# Patient Record
Sex: Male | Born: 1985 | Race: Black or African American | Hispanic: No | Marital: Single | Smoking: Light tobacco smoker
Health system: Southern US, Community
[De-identification: ages and names within clinical notes are randomized; demographics above are authoritative.]

---

## 2014-08-18 ENCOUNTER — Emergency Department (HOSPITAL_COMMUNITY)
Admission: EM | Admit: 2014-08-18 | Discharge: 2014-08-19 | Disposition: A | Discharge status: Home / Self Care | Payer: Self-pay | Attending: Emergency Medicine | Admitting: Emergency Medicine

## 2014-08-18 DIAGNOSIS — Z720 Tobacco use: Secondary | ICD-10-CM | POA: Insufficient documentation

## 2014-08-18 DIAGNOSIS — R4182 Altered mental status, unspecified: Secondary | ICD-10-CM | POA: Insufficient documentation
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
## 2014-08-19 ENCOUNTER — Emergency Department (HOSPITAL_COMMUNITY): Payer: Self-pay

## 2014-08-19 ENCOUNTER — Encounter (HOSPITAL_COMMUNITY): Payer: Self-pay | Admitting: Emergency Medicine

## 2014-08-19 LAB — COMPREHENSIVE METABOLIC PANEL
ALBUMIN: 4.7 g/dL (ref 3.5–5.2)
ALT: 41 U/L (ref 0–53)
ANION GAP: 10 (ref 5–15)
AST: 26 U/L (ref 0–37)
Alkaline Phosphatase: 70 U/L (ref 39–117)
BILIRUBIN TOTAL: 1 mg/dL (ref 0.3–1.2)
BUN: 9 mg/dL (ref 6–23)
CHLORIDE: 101 meq/L (ref 96–112)
CO2: 27 mmol/L (ref 19–32)
CREATININE: 1.37 mg/dL — AB (ref 0.50–1.35)
Calcium: 9.6 mg/dL (ref 8.4–10.5)
GFR, EST AFRICAN AMERICAN: 79 mL/min — AB (ref >90)
GFR, EST NON AFRICAN AMERICAN: 69 mL/min — AB (ref >90)
Glucose, Bld: 133 mg/dL — ABNORMAL HIGH (ref 70–99)
Potassium: 3.2 mmol/L — ABNORMAL LOW (ref 3.5–5.1)
SODIUM: 138 mmol/L (ref 135–145)
Total Protein: 8.5 g/dL — ABNORMAL HIGH (ref 6.0–8.3)

## 2014-08-19 LAB — CBC WITH DIFFERENTIAL/PLATELET
BASOS PCT: 0 % (ref 0–1)
Basophils Absolute: 0 10*3/uL (ref 0.0–0.1)
EOS ABS: 0.2 10*3/uL (ref 0.0–0.7)
Eosinophils Relative: 2 % (ref 0–5)
HEMATOCRIT: 46.2 % (ref 39.0–52.0)
HEMOGLOBIN: 16.4 g/dL (ref 13.0–17.0)
LYMPHS ABS: 1.8 10*3/uL (ref 0.7–4.0)
LYMPHS PCT: 18 % (ref 12–46)
MCH: 32.8 pg (ref 26.0–34.0)
MCHC: 35.5 g/dL (ref 30.0–36.0)
MCV: 92.4 fL (ref 78.0–100.0)
Monocytes Absolute: 0.6 10*3/uL (ref 0.1–1.0)
Monocytes Relative: 5 % (ref 3–12)
NEUTROS PCT: 75 % (ref 43–77)
Neutro Abs: 7.7 10*3/uL (ref 1.7–7.7)
Platelets: 270 10*3/uL (ref 150–400)
RBC: 5 MIL/uL (ref 4.22–5.81)
RDW: 11.8 % (ref 11.5–15.5)
WBC: 10.2 10*3/uL (ref 4.0–10.5)

## 2014-08-19 LAB — RAPID URINE DRUG SCREEN, HOSP PERFORMED
Amphetamines: NOT DETECTED
BARBITURATES: NOT DETECTED
BENZODIAZEPINES: NOT DETECTED
Cocaine: NOT DETECTED
Opiates: NOT DETECTED
Tetrahydrocannabinol: POSITIVE — AB

## 2014-08-19 LAB — URINALYSIS, ROUTINE W REFLEX MICROSCOPIC
BILIRUBIN URINE: NEGATIVE
GLUCOSE, UA: NEGATIVE mg/dL
Hgb urine dipstick: NEGATIVE
KETONES UR: NEGATIVE mg/dL
Leukocytes, UA: NEGATIVE
Nitrite: NEGATIVE
PH: 6 (ref 5.0–8.0)
PROTEIN: NEGATIVE mg/dL
SPECIFIC GRAVITY, URINE: 1.011 (ref 1.005–1.030)
Urobilinogen, UA: 0.2 mg/dL (ref 0.0–1.0)

## 2014-08-19 LAB — ETHANOL

## 2014-08-19 NOTE — ED Notes (Signed)
Patient brought in by EMS. Pt was found bent over drooling and unable to talk by [employer]. Patient unable to talk coherently. When able to communicate patient asked the same questions over and over again.

## 2014-08-19 NOTE — Discharge Instructions (Signed)

## 2016-04-23 ENCOUNTER — Encounter: Payer: Self-pay | Admitting: Emergency Medicine

## 2016-04-23 ENCOUNTER — Emergency Department
Admission: EM | Admit: 2016-04-23 | Discharge: 2016-04-23 | Disposition: A | Discharge status: Home / Self Care | Attending: Emergency Medicine | Admitting: Emergency Medicine

## 2016-04-23 DIAGNOSIS — F17210 Nicotine dependence, cigarettes, uncomplicated: Secondary | ICD-10-CM | POA: Insufficient documentation

## 2016-04-23 DIAGNOSIS — Y9389 Activity, other specified: Secondary | ICD-10-CM | POA: Insufficient documentation

## 2016-04-23 DIAGNOSIS — S0181XA Laceration without foreign body of other part of head, initial encounter: Secondary | ICD-10-CM | POA: Diagnosis not present

## 2016-04-23 DIAGNOSIS — Y999 Unspecified external cause status: Secondary | ICD-10-CM | POA: Diagnosis not present

## 2016-04-23 DIAGNOSIS — Y929 Unspecified place or not applicable: Secondary | ICD-10-CM | POA: Diagnosis not present

## 2016-04-23 NOTE — Discharge Instructions (Signed)
Advised not to shave area for one week.
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
## 2020-10-09 ENCOUNTER — Ambulatory Visit (HOSPITAL_COMMUNITY)
Admission: EM | Admit: 2020-10-09 | Discharge: 2020-10-09 | Disposition: A | Discharge status: Home / Self Care | Payer: Medicaid - Out of State

## 2020-10-09 ENCOUNTER — Other Ambulatory Visit: Payer: Self-pay

## 2020-10-09 NOTE — ED Notes (Signed)
PT belongings in orange locker.

## 2020-10-09 NOTE — ED Notes (Signed)
Pt accepted vitals being taken. When writer redirected pt to have a seat for more accurate vitals, pt stood up abruptly, yanked Blood Pressure cuff off arm, and rushed out of assessment room.

## 2020-10-09 NOTE — BH Assessment (Signed)
LWBS/AWOL:  Pt arrived to the [facility] via his cousin, who states she had insisted he come tonight. Pt's cousin shared pt has been acting paranoid and thinks people are out to get him; clinician witnessed pt acting nervous when he walked past security (holding his cousin close to him). Pt's cousin shared pt used PCP last Monday - Wednesday; she shares pt had been in prison for 3 1/2 years and was released in February. Pt's cousin shared pt has not been sleeping and that his family is worried.  Pt refused to complete the assessment, stating he wanted to go home to rest and return in the morning. Pt denied SI, HI, or AVH. Despite re-directions and requests, pt demanded to leave and walked out of the room. Clinician provided pt's cousin with information so she could IVC patient.

## 2020-10-10 ENCOUNTER — Ambulatory Visit (HOSPITAL_COMMUNITY)
Admission: EM | Admit: 2020-10-10 | Discharge: 2020-10-11 | Disposition: A | Discharge status: Home / Self Care | Payer: Medicaid - Out of State | Attending: Psychiatry | Admitting: Psychiatry

## 2020-10-10 ENCOUNTER — Telehealth (HOSPITAL_COMMUNITY): Payer: Self-pay | Admitting: General Practice

## 2020-10-10 ENCOUNTER — Other Ambulatory Visit: Payer: Self-pay

## 2020-10-10 DIAGNOSIS — F17210 Nicotine dependence, cigarettes, uncomplicated: Secondary | ICD-10-CM | POA: Diagnosis not present

## 2020-10-10 DIAGNOSIS — F19959 Other psychoactive substance use, unspecified with psychoactive substance-induced psychotic disorder, unspecified: Secondary | ICD-10-CM | POA: Insufficient documentation

## 2020-10-10 DIAGNOSIS — Z20822 Contact with and (suspected) exposure to covid-19: Secondary | ICD-10-CM | POA: Insufficient documentation

## 2020-10-10 LAB — LIPID PANEL
Cholesterol: 193 mg/dL (ref 0–200)
HDL: 41 mg/dL (ref >40)
LDL Cholesterol: 125 mg/dL — ABNORMAL HIGH (ref 0–99)
Total CHOL/HDL Ratio: 4.7 RATIO
Triglycerides: 133 mg/dL (ref <150)
VLDL: 27 mg/dL (ref 0–40)

## 2020-10-10 LAB — COMPREHENSIVE METABOLIC PANEL
ALT: 21 U/L (ref 0–44)
AST: 33 U/L (ref 15–41)
Albumin: 4.6 g/dL (ref 3.5–5.0)
Alkaline Phosphatase: 59 U/L (ref 38–126)
Anion gap: 10 (ref 5–15)
BUN: 7 mg/dL (ref 6–20)
CO2: 22 mmol/L (ref 22–32)
Calcium: 10.1 mg/dL (ref 8.9–10.3)
Chloride: 99 mmol/L (ref 98–111)
Creatinine, Ser: 0.97 mg/dL (ref 0.61–1.24)
GFR, Estimated: >60 mL/min (ref >60)
Glucose, Bld: 84 mg/dL (ref 70–99)
Potassium: 4.1 mmol/L (ref 3.5–5.1)
Sodium: 131 mmol/L — ABNORMAL LOW (ref 135–145)
Total Bilirubin: 1.3 mg/dL — ABNORMAL HIGH (ref 0.3–1.2)
Total Protein: 7.7 g/dL (ref 6.5–8.1)

## 2020-10-10 LAB — HEMOGLOBIN A1C
Hgb A1c MFr Bld: 4.7 % — ABNORMAL LOW (ref 4.8–5.6)
Mean Plasma Glucose: 88.19 mg/dL

## 2020-10-10 LAB — CBC WITH DIFFERENTIAL/PLATELET
Abs Immature Granulocytes: 0.04 10*3/uL (ref 0.00–0.07)
Basophils Absolute: 0.1 10*3/uL (ref 0.0–0.1)
Basophils Relative: 1 %
Eosinophils Absolute: 0.1 10*3/uL (ref 0.0–0.5)
Eosinophils Relative: 1 %
HCT: 49.1 % (ref 39.0–52.0)
Hemoglobin: 16.8 g/dL (ref 13.0–17.0)
Immature Granulocytes: 0 %
Lymphocytes Relative: 14 %
Lymphs Abs: 1.9 10*3/uL (ref 0.7–4.0)
MCH: 32.7 pg (ref 26.0–34.0)
MCHC: 34.2 g/dL (ref 30.0–36.0)
MCV: 95.7 fL (ref 80.0–100.0)
Monocytes Absolute: 0.8 10*3/uL (ref 0.1–1.0)
Monocytes Relative: 6 %
Neutro Abs: 10.4 10*3/uL — ABNORMAL HIGH (ref 1.7–7.7)
Neutrophils Relative %: 78 %
Platelets: 282 10*3/uL (ref 150–400)
RBC: 5.13 MIL/uL (ref 4.22–5.81)
RDW: 12.3 % (ref 11.5–15.5)
WBC: 13.3 10*3/uL — ABNORMAL HIGH (ref 4.0–10.5)
nRBC: 0 % (ref 0.0–0.2)

## 2020-10-10 LAB — POCT URINE DRUG SCREEN - MANUAL ENTRY (I-CUP)
POC Amphetamine UR: NOT DETECTED
POC Marijuana UR: POSITIVE — AB
POC Methadone UR: NOT DETECTED
POC Morphine: NOT DETECTED
POC Oxazepam (BZO): NOT DETECTED
POC Secobarbital (BAR): NOT DETECTED

## 2020-10-10 LAB — POC SARS CORONAVIRUS 2 AG: SARS Coronavirus 2 Ag: NEGATIVE

## 2020-10-10 LAB — RESP PANEL BY RT-PCR (FLU A&B, COVID) ARPGX2
Influenza A by PCR: NEGATIVE
Influenza B by PCR: NEGATIVE
SARS Coronavirus 2 by RT PCR: NEGATIVE

## 2020-10-10 LAB — POCT URINE DRUG SCREEN - MANUAL ENTRY (I-SCREEN)
POC Buprenorphine (BUP): NOT DETECTED
POC Cocaine UR: NOT DETECTED
POC Methamphetamine UR: NOT DETECTED
POC Oxycodone UR: NOT DETECTED

## 2020-10-10 LAB — ETHANOL: Alcohol, Ethyl (B): <10 mg/dL (ref <10)

## 2020-10-10 MED ORDER — HYDROXYZINE HCL 25 MG PO TABS
25.0000 mg | ORAL_TABLET | Freq: Three times a day (TID) | ORAL | Status: DC | PRN
  Administered 2020-10-10: 25 mg via ORAL
  Filled 2020-10-10: qty 1

## 2020-10-10 MED ORDER — TRAZODONE HCL 50 MG PO TABS
50.0000 mg | ORAL_TABLET | Freq: Every evening | ORAL | Status: DC | PRN
  Administered 2020-10-10: 50 mg via ORAL
  Filled 2020-10-10: qty 1

## 2020-10-10 MED ORDER — MAGNESIUM HYDROXIDE 400 MG/5ML PO SUSP
30.0000 mL | Freq: Every day | ORAL | Status: DC | PRN
Start: 2020-10-10 — End: 2020-10-11

## 2020-10-10 MED ORDER — ALUM & MAG HYDROXIDE-SIMETH 200-200-20 MG/5ML PO SUSP: 30.0000 mL | ORAL | Status: DC | PRN

## 2020-10-10 MED ORDER — ACETAMINOPHEN 325 MG PO TABS: 650.0000 mg | ORAL_TABLET | Freq: Four times a day (QID) | ORAL | Status: DC | PRN

## 2020-10-10 MED ORDER — OLANZAPINE 10 MG PO TBDP
10.0000 mg | ORAL_TABLET | Freq: Every day | ORAL | Status: DC
  Administered 2020-10-10 – 2020-10-11 (×2): 10 mg via ORAL
  Filled 2020-10-10 (×2): qty 1

## 2020-10-10 NOTE — ED Notes (Addendum)
Pt up to nurse's station again asking about his address. Pt states, "[address], we good?" RN advised registration changed address per pt's request. Pt given cheese-its and grape juice. No signs of acute distress noted. Pt went to lay back down in bed. Will continue to monitor for safety.

## 2020-10-10 NOTE — Telephone Encounter (Signed)
Care Management - Follow Up [facility] Discharges   Writer attempted to make contact with patient today and was unsuccessful.  Writer was able to leave a HIPPA compliant voice message and will await callback.   

## 2020-10-10 NOTE — Progress Notes (Signed)
Pt is admitted to OBS bed 5 due to paranoia and hallucinations. Pt is alert, tangential and with disorganized thoughts. Pt was cooperative with skin assessment. Pt oriented to staff and unit. Pt denies pain, SI, HI and AVH at this time. Administered scheduled medication with no incident. Staff will monitor for pt's safety.

## 2020-10-10 NOTE — BH Assessment (Signed)
Comprehensive Clinical Assessment (CCA) Note  10/10/2020 Peter Holland [id number]  Disposition: Per Earlene PlaterKatherine Laubach, MD, patient is recommended for overnight observation. Patient is currently voluntary and per MD patient is appropriate for IVC if needed.   Peter Holland is a 35 year old male [facility] with chief complaint of paranoia. Patient reported reason for visit today is "mother fuckers ain't safe outside". Patient report that he is here to get his "shit together" and points to his head. Patient is using a lot of profanity and his girlfriend interjects and tell patient not to curse and to tell the truth about what is going on with him. Patient does not fully express his concerns rather reports that he is having difficulty trusting people and needing to do something to better his situation. Patient denies SI but made suicidal statements yesterday due to remorse from using drugs and having side effects. Patient reports AVH but unable to provide specifics. Patient also acknowledges thought broadcasting but denies though insertion and thought withdrawal.    Girlfriend reports that patient used PCP last week and since using the drugs he has been paranoid and delusional. Girlfriend reports that patient used these drugs for about 3 days. Girlfriend reports that patient also smokes marijuana. Girlfriend reports that patient was released from jail in February and when he was released, he was "normal" and not experiencing these symptoms. Girlfriend reports that patient is "ok" one minute and the next he is paranoid telling her to lock the doors because the police is out to get him. Girlfriend reports that patient does not have a psychiatric history and has never had outpatient/inpatient services.  Girlfriend reports that patient had a hard childhood and had to raise himself. Girlfriend reports that patient mother died when he was younger, and his sister died some time ago on patient  birthday. [facility] a cousin but decided to leave due to paranoia.  Patient is oriented to person and place. Patient is alert, engaged and somewhat cooperative during assessment. Patient eye contact is normal, and he communicates with a lot of profanity and slang. Patient redirectable by his girlfriend. Patient presenting with paranoid delusions, thought broadcasting, and AVH. Patient calm at times however, at the end of the assessment patient put his feet on the table and then passed gas and laughed. Patient 4 children and girlfriend of 12 years are his motivation for treatment today.    The patient demonstrates the following risk factors for suicide: Chronic risk factors for suicide include: N/A. Acute risk factors for suicide include: recent release from jail. Protective factors for this patient include: positive social support, responsibility to others (children, family) and hope for the future. Considering these factors, the overall suicide risk at this point appears to be low. Patient is not appropriate for outpatient follow up.    Chief Complaint:  Chief Complaint  Patient presents with  . Urgent emergent evaluation   Visit Diagnosis: Substance-induced psychotic disorder (HCC)    CCA Screening, Triage and Referral (STR)  Patient Reported Information How did you hear about us? Family/Friend  Referral name: No data recorded Referral phone number: No data recorded  Whom do you see for routine medical problems? No data recorded Practice/Facility Name: No data recorded Practice/Facility Phone Number: No data recorded Name of Contact: No data recorded Contact Number: No data recorded Contact Fax Number: No data recorded Prescriber Name: No data recorded Prescriber Address (if known): No data recorded  What Is the Reason for  Your Visit/Call Today? paranoid / delusional  How Long Has This Been Causing You Problems? 1-6 months  What Do You Feel Would  Help You the Most Today? Treatment for Depression or other mood problem; Alcohol or Drug Use Treatment; Stress Management; Medication(s)   Have You Recently Been in Any Inpatient Treatment (Hospital/Detox/Crisis Center/28-Day Program)? No data recorded Name/Location of Program/Hospital:No data recorded How Long Were You There? No data recorded When Were You Discharged? No data recorded  Have You Ever Received Services From [facility] Before? No data recorded Who Do You See at [facility]? No data recorded  Have You Recently Had Any Thoughts About Hurting Yourself? No  Are You Planning to Commit Suicide/Harm Yourself At This time? No   Have you Recently Had Thoughts About Hurting Someone Karolee Ohs? No  Explanation: No data recorded  Have You Used Any Alcohol or Drugs in the Past 24 Hours? Yes  How Long Ago Did You Use Drugs or Alcohol? No data recorded What Did You Use and How Much? unknown   Do You Currently Have a Therapist/Psychiatrist? No data recorded Name of Therapist/Psychiatrist: No data recorded  Have You Been Recently Discharged From Any Office Practice or Programs? No data recorded Explanation of Discharge From Practice/Program: No data recorded    CCA Screening Triage Referral Assessment Type of Contact: No data recorded Is this Initial or Reassessment? No data recorded Date Telepsych consult ordered in [facility]:  No data recorded Time Telepsych consult ordered in [facility]:  No data recorded  Patient Reported Information Reviewed? No data recorded Patient Left Without Being Seen? No data recorded Reason for Not Completing Assessment: No data recorded  Collateral Involvement: No data recorded  Does Patient Have a Court Appointed Legal Guardian? No data recorded Name and Contact of Legal Guardian: No data recorded If Minor and Not Living with Parent(s), Who has Custody? No data recorded Is CPS involved or ever been involved? No data recorded Is APS involved or ever been  involved? No data recorded  Patient Determined To Be At Risk for Harm To Self or Others Based on Review of Patient Reported Information or Presenting Complaint? No data recorded Method: No data recorded Availability of Means: No data recorded Intent: No data recorded Notification Required: No data recorded Additional Information for Danger to Others Potential: No data recorded Additional Comments for Danger to Others Potential: No data recorded Are There Guns or Other Weapons in Your Home? No data recorded Types of Guns/Weapons: No data recorded Are These Weapons Safely Secured?                            No data recorded Who Could Verify You Are Able To Have These Secured: No data recorded Do You Have any Outstanding Charges, Pending Court Dates, Parole/Probation? No data recorded Contacted To Inform of Risk of Harm To Self or Others: No data recorded  Location of Assessment: No data recorded  Does Patient Present under Involuntary Commitment? No data recorded IVC Papers Initial File Date: No data recorded  [place] of Residence: No data recorded  Patient Currently Receiving the Following Services: No data recorded  Determination of Need: Urgent (48 hours)   Options For Referral: Medication Management; Outpatient Therapy; Partial Hospitalization     CCA Biopsychosocial Intake/Chief Complaint:  Paranoia, delusions  Current Symptoms/Problems: paranoia   Patient Reported Schizophrenia/Schizoaffective Diagnosis in Past: No   Strengths: UTA  Preferences: UTA  Abilities: UTA   Type  of Services Patient Feels are Needed: medications   Initial Clinical Notes/Concerns: No data recorded  Mental Health Symptoms Depression:  None   Duration of Depressive symptoms: No data recorded  Mania:  N/A   Anxiety:   Worrying; Tension   Psychosis:  Delusions   Duration of Psychotic symptoms: Less than six months   Trauma:  Hypervigilance   Obsessions:  None   Compulsions:   None   Inattention:  None   Hyperactivity/Impulsivity:  N/A   Oppositional/Defiant Behaviors:  None   Emotional Irregularity:  None   Other Mood/Personality Symptoms:  No data recorded   Mental Status Exam Appearance and self-care  Stature:  Average   Weight:  Overweight   Clothing:  Neat/clean; Age-appropriate   Grooming:  Normal   Cosmetic use:  None   Posture/gait:  Normal   Motor activity:  Not Remarkable   Sensorium  Attention:  Normal   Concentration:  Anxiety interferes   Orientation:  Place; Person; Situation   Recall/memory:  Defective in Short-term   Affect and Mood  Affect:  Anxious   Mood:  Anxious   Relating  Eye contact:  Normal   Facial expression:  Anxious   Attitude toward examiner:  Cooperative   Thought and Language  Speech flow: Profane   Thought content:  Delusions; Persecutions; Suspicious   Preoccupation:  None   Hallucinations:  Auditory; Visual   Organization:  No data recorded  [employer] of Knowledge:  Impoverished by (Comment)   Intelligence:  Average   Abstraction:  Normal   Judgement:  Impaired   Reality Testing:  Variable   Insight:  Fair   Decision Making:  Normal   Social Functioning  Social Maturity:  Responsible   Social Judgement:  "Street Smart"   Stress  Stressors:  Grief/losses; Legal   Coping Ability:  Overwhelmed   Skill Deficits:  None   Supports:  Family     Religion:    Leisure/Recreation:    Exercise/Diet: Exercise/Diet Do You Have Any Trouble Sleeping?: Yes   CCA Employment/Education Employment/Work Situation: Employment / Work Situation 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 How long has patient been employed?: UTA What is the longest time patient has a held a job?: UTA Where was the patient employed at that time?: UTA  Education: Education Is Patient Currently Attending School?: No   CCA Family/Childhood History Family and Relationship  History: Family history Are you sexually active?: Yes What is your sexual orientation?: UTA Has your sexual activity been affected by drugs, alcohol, medication, or emotional stress?: UTA Does patient have children?: Yes How many children?: 4  Childhood History:  Childhood History Additional childhood history information: UTA Description of patient's relationship with caregiver when they were a child: UTA Patient's description of current relationship with people who raised him/her: UTA How were you disciplined when you got in trouble as a child/adolescent?: UTA Does patient have siblings?: Yes  Child/Adolescent Assessment:     CCA Substance Use Alcohol/Drug Use: Alcohol / Drug Use Pain Medications: See MAR Prescriptions: See MAR Over the Counter: See MAR History of alcohol / drug use?: Yes Substance #1 Name of Substance 1: THC Substance #2 Name of Substance 2: PCP 2 - Age of First Use: 34 2 - Last Use / Amount: 10/05/2020                     ASAM's:  Six Dimensions of Multidimensional Assessment  Dimension 1:  Acute Intoxication and/or  Withdrawal Potential:      Dimension 2:  Biomedical Conditions and Complications:      Dimension 3:  Emotional, Behavioral, or Cognitive Conditions and Complications:     Dimension 4:  Readiness to Change:     Dimension 5:  Relapse, Continued use, or Continued Problem Potential:     Dimension 6:  Recovery/Living Environment:     ASAM Severity Score:    ASAM Recommended Level of Treatment: ASAM Recommended Level of Treatment: Level I Outpatient Treatment   Substance use Disorder (SUD)    Recommendations for Services/Supports/Treatments: Recommendations for Services/Supports/Treatments Recommendations For Services/Supports/Treatments: Individual Therapy  DSM5 Diagnoses: There are no problems to display for this patient.   Disposition: Per Earlene Plater, MD, patient is recommended for overnight observation. Patient is  currently voluntary and per MD patient is appropriate for IVC if needed.   Jeziah Kretschmer Shirlee More, [facility]

## 2020-10-10 NOTE — BH Assessment (Signed)
Patient presents voluntarily to [facility] accompanied by his girl friend and cousin. Patient left yesterday with out being seen reports paranoia and hallucinations. Patient reports being released from jail in February and since has been experiencing  symptoms of paranoia . Patient denies SI/ HI but endorses using PCP/ THC unknown amounts since being released. Patient tearful stating" I just need to get my shit together". Patient accompanied by his girlfriend . Patient will be seen by TTS and Provider / Patient is urgent.   Marland Kitchen

## 2020-10-10 NOTE — ED Notes (Signed)
Pt resting in bed watching TV. A&O x4, calm and cooperative. No signs of acute distress noted. Will continue to monitor for safety.  

## 2020-10-10 NOTE — ED Notes (Addendum)
Pt continues to ask about his address and is becoming increasingly agitated. Pt stating, "that shit crazy, how the fuck they got [address]." Pt states he lives at "[address] Speech is tangential and disorganized and pt is pacing from bed to nurse's station. Pt took PRN medications without incident and accepted another PB&J and grape juice. Will continue to monitor for safety.

## 2020-10-10 NOTE — ED Notes (Addendum)
Pt up to nurse's station asking what address is listed in his chart. Pt states this is the wrong address and requests to have it changed. Pt wrote down address and Hilda Lias, RN/AC gave it to registration. Pt appears paranoid and continues to ask different staff members about the address several times. Pt given PB&J and now talking on phone on unit. No acute distress noted. Will continue to monitor for safety.

## 2020-10-10 NOTE — ED Provider Notes (Signed)
Behavioral Health Admission H&P [facility] & OBS)  Date: 10/10/20 Patient Name: Romin Divita MRN: [id number] Chief Complaint:  Chief Complaint  Patient presents with  . Urgent emergent evaluation   Chief Complaint/Presenting Problem: Paranoia, delusions  Diagnoses:  Final diagnoses:  Substance-induced psychotic disorder (HCC)    HPI: 35 yo male with no previous psychiatric history who presents for paranoia and hallucinations since using PCP; pt arrives accompanied by girlfriend. Patient interviewed with GF present in the room. Pt noted to be paranoid and is unable/unwilling to provide answers to most questions asked for which GF provides answers. Pt states that he came to the [facility] to "get my mind together" and that "motherfuckers aren't safe outside". Pt reports not feeling safe for several days and states that he believes people are out to get him; no reported specific person or people, states "everybody" is out to get him; however, does specifically  cites law enforcement as individuals he is paranoid about. Pt reports occasional AH, states most recently heard today and describes as a voice saying "I'm gonna be alright". He endorses VH when asked but is unable to describe. Marland Kitchen He denies TI/TW, but reports TB. He denies SI/HI. Patient appears paranoid/suspicious throughout assessment  Majority of history collected from [place] [phone]). She states that she has known Temaine for 12 years and they have 4 kids together. She has never seen him like this before. To her knowledge he has no previous psychiatric care. She states that he became paranoid and delusional on Thursday, states that he bought a liquid off the street that he was told was PCP. He has been dipping his cigarettes in it and smoking it. She states that he had been doing it for a couple days prior to symptom onset Thursday but has not used since. She states that despite not using for the last several days, his symptoms persist.  She states that he is extremely paranoid, thinks people are trying to harm him. He tells her to turn off all the lights and lock the doors. She states that he does not usually use recreational substance apart from marijuana. She states that he came [facility] yesterday with his cousin but left when he saw security officers d/t his paranoid surrounding Event organiser. Patient was incarcerated for 3.5 years and was released on February 6th. He has a Research officer, trade union. GF states he was his normal self up until he started using the suspected PCP. Yesterday he made a comment to a family member about wanting to kill himself. Discussed with GF outside of the room, that if patient attempts to leave, he may need to be IVC'd which involves him being served paperwork by Event organiser (pt reporting paranoia re: Event organiser); GF verbalized understanding.   PHQ 2-9:     Total Time spent with patient: 30 minutes  Musculoskeletal  Strength & Muscle Tone: within normal limits Gait & Station: normal Patient leans: N/A  Psychiatric Specialty Exam  Presentation General Appearance: Appropriate for Environment; Casual  Eye Contact:Other (comment); Fleeting (intermittent)  Speech:Clear and Coherent; Normal Rate; Other (comment) (mostly clear and coherent, mumbled at times)  Speech Volume:Normal  Handedness:No data recorded  Mood and Affect  Mood:Anxious  Affect:Appropriate; Congruent   Thought Process  Thought Processes:Coherent  Descriptions of Associations:Intact  Orientation:Full (Time, Place and Person)  Thought Content:Paranoid Ideation; Delusions   Hallucinations:Hallucinations: Auditory; Visual Description of Auditory Hallucinations: last heard today, voices saying "I'm gonan be alright" Description of Visual Hallucinations: reports VH  but unable to describe  Ideas of Reference:Paranoia  Suicidal Thoughts:Suicidal Thoughts: No  Homicidal Thoughts:Homicidal Thoughts:  No   Sensorium  Memory:Immediate Good; Recent Fair; Remote [place]   Executive Functions  Concentration:Fair  Attention Span:Fair  [place]   Psychomotor Activity  Psychomotor Activity:Psychomotor Activity: Restlessness   Assets  Assets:Communication Skills; Desire for Improvement; Financial Resources/Insurance; Housing; Social Support; Resilience   Sleep  Sleep:Sleep: Poor   Nutritional Assessment (For OBS and FBC admissions only) Has the patient had a weight loss or gain of 10 pounds or more in the last 3 months?: -- (UTA. Patient paranoa interfering with answering questions) Has the patient had a decrease in food intake/or appetite?: -- (UTA. Patient paranoa interfering with answering questions) Does the patient have dental problems?: -- (UTA. Patient paranoa interfering with answering questions) Does the patient have eating habits or behaviors that may be indicators of an eating disorder including binging or inducing vomiting?: -- (UTA. Patient paranoa interfering with answering questions) Has the patient recently lost weight without trying?: -- (UTA. Patient paranoa interfering with answering questions) Has the patient been eating poorly because of a decreased appetite?: -- (UTA. Patient paranoa interfering with answering questions)    Physical Exam Constitutional:      Appearance: Normal appearance.  HENT:     Head: Normocephalic and atraumatic.  Pulmonary:     Effort: Pulmonary effort is normal.  Neurological:     Mental Status: He is alert.    Review of Systems  Constitutional: Negative for chills and fever.  HENT: Negative for hearing loss.   Respiratory: Negative for cough.   Cardiovascular: Negative for chest pain.  Gastrointestinal: Negative for abdominal pain.  Musculoskeletal: Negative for myalgias.  Neurological: Negative for headaches.  Psychiatric/Behavioral:  Positive for hallucinations and substance abuse. Negative for suicidal ideas.    Blood pressure (!) 154/113, pulse (!) 104, temperature 98.2 F (36.8 C), temperature source Temporal, resp. rate 18, height _0  (1.778 m), weight 111.6 kg, SpO2 98 %. Body mass index is 35.3 kg/m.  Past Psychiatric History: none   Is the patient at risk to self? Yes  Has the patient been a risk to self in the past 6 months? No .    Has the patient been a risk to self within the distant past? No   Is the patient a risk to others? Yes   Has the patient been a risk to others in the past 6 months? No   Has the patient been a risk to others within the distant past? No   Past Medical History: No past medical history on file. No past surgical history on file.  Family History: No family history on file.  Social History:  Social History   Socioeconomic History  . Marital status: Single    Spouse name: Not on file  . Number of children: Not on file  . Years of education: Not on file  . Highest education level: Not on file  Occupational History  . Not on file  Tobacco Use  . Smoking status: Light Tobacco Smoker    Types: Cigarettes  . Smokeless tobacco: Never Used  Substance and Sexual Activity  . Alcohol use: No  . Drug use: No  . Sexual activity: Not on file  Other Topics Concern  . Not on file  Social History Narrative  . Not on file   Social Determinants of Health   Financial Resource Strain: Not on  file  Food Insecurity: Not on file  Transportation Needs: Not on file  Physical Activity: Not on file  Stress: Not on file  Social Connections: Not on file  Intimate Partner Violence: Not on file    SDOH:  SDOH Screenings   Alcohol Screen: Not on file  Depression (XTG6-2): Not on file  Financial Resource Strain: Not on file  Food Insecurity: Not on file  Housing: Not on file  Physical Activity: Not on file  Social Connections: Not on file  Stress: Not on file  Tobacco Use: High  Risk  . Smoking Tobacco Use: Light Tobacco Smoker  . Smokeless Tobacco Use: Never Used  Transportation Needs: Not on file    Last Labs:  No visits with results within 6 Month(s) from this visit.  Latest known visit with results is:  Admission on 08/18/2014, Discharged on 08/19/2014  Component Date Value Ref Range Status  . WBC 08/18/2014 10.2  4.0 - 10.5 K/uL Final  . RBC 08/18/2014 5.00  4.22 - 5.81 MIL/uL Final  . Hemoglobin 08/18/2014 16.4  13.0 - 17.0 g/dL Final  . HCT 08/18/2014 46.2  39.0 - 52.0 % Final  . MCV 08/18/2014 92.4  78.0 - 100.0 fL Final  . MCH 08/18/2014 32.8  26.0 - 34.0 pg Final  . MCHC 08/18/2014 35.5  30.0 - 36.0 g/dL Final  . RDW 08/18/2014 11.8  11.5 - 15.5 % Final  . Platelets 08/18/2014 270  150 - 400 K/uL Final  . Neutrophils Relative % 08/18/2014 75  43 - 77 % Final  . Neutro Abs 08/18/2014 7.7  1.7 - 7.7 K/uL Final  . Lymphocytes Relative 08/18/2014 18  12 - 46 % Final  . Lymphs Abs 08/18/2014 1.8  0.7 - 4.0 K/uL Final  . Monocytes Relative 08/18/2014 5  3 - 12 % Final  . Monocytes Absolute 08/18/2014 0.6  0.1 - 1.0 K/uL Final  . Eosinophils Relative 08/18/2014 2  0 - 5 % Final  . Eosinophils Absolute 08/18/2014 0.2  0.0 - 0.7 K/uL Final  . Basophils Relative 08/18/2014 0  0 - 1 % Final  . Basophils Absolute 08/18/2014 0.0  0.0 - 0.1 K/uL Final  . Sodium 08/18/2014 138  135 - 145 mmol/L Final   Please note change in reference range.  . Potassium 08/18/2014 3.2* 3.5 - 5.1 mmol/L Final   Please note change in reference range.  . Chloride 08/18/2014 101  96 - 112 mEq/L Final  . CO2 08/18/2014 27  19 - 32 mmol/L Final  . Glucose, Bld 08/18/2014 133* 70 - 99 mg/dL Final  . BUN 08/18/2014 9  6 - 23 mg/dL Final  . Creatinine, Ser 08/18/2014 1.37* 0.50 - 1.35 mg/dL Final  . Calcium 08/18/2014 9.6  8.4 - 10.5 mg/dL Final  . Total Protein 08/18/2014 8.5* 6.0 - 8.3 g/dL Final  . Albumin 08/18/2014 4.7  3.5 - 5.2 g/dL Final  . AST 08/18/2014 26  0 - 37 U/L  Final  . ALT 08/18/2014 41  0 - 53 U/L Final  . Alkaline Phosphatase 08/18/2014 70  39 - 117 U/L Final  . Total Bilirubin 08/18/2014 1.0  0.3 - 1.2 mg/dL Final  . GFR calc non Af Amer 08/18/2014 69* >90 mL/min Final  . GFR calc Af Amer 08/18/2014 79* >90 mL/min Final   Comment: (NOTE) The eGFR has been calculated using the CKD EPI equation. This calculation has not been validated in all clinical situations. eGFR's persistently <90 mL/min signify  possible Chronic Kidney Disease.   . Anion gap 08/18/2014 10  5 - 15 Final  . Color, Urine 08/19/2014 YELLOW  YELLOW Final  . APPearance 08/19/2014 CLEAR  CLEAR Final  . Specific Gravity, Urine 08/19/2014 1.011  1.005 - 1.030 Final  . pH 08/19/2014 6.0  5.0 - 8.0 Final  . Glucose, UA 08/19/2014 NEGATIVE  NEGATIVE mg/dL Final  . Hgb urine dipstick 08/19/2014 NEGATIVE  NEGATIVE Final  . Bilirubin Urine 08/19/2014 NEGATIVE  NEGATIVE Final  . Ketones, ur 08/19/2014 NEGATIVE  NEGATIVE mg/dL Final  . Protein, ur 08/19/2014 NEGATIVE  NEGATIVE mg/dL Final  . Urobilinogen, UA 08/19/2014 0.2  0.0 - 1.0 mg/dL Final  . Nitrite 08/19/2014 NEGATIVE  NEGATIVE Final  . Leukocytes, UA 08/19/2014 NEGATIVE  NEGATIVE Final   MICROSCOPIC NOT DONE ON URINES WITH NEGATIVE PROTEIN, BLOOD, LEUKOCYTES, NITRITE, OR GLUCOSE <1000 mg/dL.  Marland Kitchen Opiates 08/19/2014 NONE DETECTED  NONE DETECTED Final  . Cocaine 08/19/2014 NONE DETECTED  NONE DETECTED Final  . Benzodiazepines 08/19/2014 NONE DETECTED  NONE DETECTED Final  . Amphetamines 08/19/2014 NONE DETECTED  NONE DETECTED Final  . Tetrahydrocannabinol 08/19/2014 POSITIVE* NONE DETECTED Final  . Barbiturates 08/19/2014 NONE DETECTED  NONE DETECTED Final   Comment:        DRUG SCREEN FOR MEDICAL PURPOSES ONLY.  IF CONFIRMATION IS NEEDED FOR ANY PURPOSE, NOTIFY LAB WITHIN 5 DAYS.        LOWEST DETECTABLE LIMITS FOR URINE DRUG SCREEN Drug Class       Cutoff (ng/mL) Amphetamine      1000 Barbiturate       200 Benzodiazepine   830 Tricyclics       940 Opiates          300 Cocaine          300 THC              50   . Alcohol, Ethyl (B) 08/18/2014 <5  0 - 9 mg/dL Final   Comment:        LOWEST DETECTABLE LIMIT FOR SERUM ALCOHOL IS 11 mg/dL FOR MEDICAL PURPOSES ONLY     Allergies: Patient has no known allergies.  PTA Medications: (Not in a hospital admission)   Medical Decision Making  Patient is currently psychotic and paranoid and would benefit from overnight observation an initiation of medication.  Routine Labs ordered- CBC, CMP, TSH, a1c, lipid panel, ethanol, UDS, , EKG.  -start zyprexa 10 mg qhs for psychosis     Recommendations  Based on my evaluation the patient does not appear to have an emergency medical condition.  Ival Bible, MD 10/10/20  2:57 PM

## 2020-10-10 NOTE — ED Notes (Signed)
Pt up to nurse's station requesting more grape juice. RN provided. Pt now on phone. No signs of acute distress noted. Will continue to monitor for safety.

## 2020-10-11 MED ORDER — OLANZAPINE 10 MG PO TBDP
10.0000 mg | ORAL_TABLET | Freq: Once | ORAL | Status: AC
  Administered 2020-10-11: 10 mg via ORAL
  Filled 2020-10-11: qty 1

## 2020-10-11 MED ORDER — LORAZEPAM 1 MG PO TABS
1.0000 mg | ORAL_TABLET | Freq: Once | ORAL | Status: AC
  Administered 2020-10-11: 1 mg via ORAL
  Filled 2020-10-11: qty 1

## 2020-10-11 MED ORDER — OLANZAPINE 10 MG PO TBDP: 10.0000 mg | ORAL_TABLET | Freq: Every day | ORAL | 0 refills | Status: DC

## 2020-10-11 NOTE — Discharge Instructions (Signed)

## 2020-10-11 NOTE — ED Notes (Signed)
Pt woke up and walked over to nurse's station, asking what RN was looking at on computer. Pt remains paranoid and disorganized. Pt now talking on phone on unit. No signs of acute distress noted. Will continue to monitor for safety.

## 2020-10-11 NOTE — ED Notes (Signed)
Pt at nurse's station stating staff and other patients are with the [employer]. Pt is asking when he got to [facility] and stating he has "been here longer than that." Pt continuously stating, "something ain't right" and becoming agitated. Pt now on phone. Will continue to monitor for safety.

## 2020-10-11 NOTE — ED Notes (Signed)
Pt on the phone

## 2020-10-11 NOTE — ED Provider Notes (Signed)
FBC/OBS ASAP Discharge Summary  Date and Time: 10/11/2020 1:56 PM  Name: Peter Holland  MRN:  [id number]   Discharge Diagnoses:  Final diagnoses:  Substance-induced psychotic disorder [facility])    Subjective: Patient reports that he is feeling better today.  Patient denies any suicidal homicidal ideations and denies any hallucinations.  Patient also reports that he does not feel paranoid today.  He states that he feels the medications that he has been taking since he has been here has helped out with that.  He reports that he slept well last night.  Patient provides consent for me to contact his girlfriend and states that he has spoken her and feels that he is ready to go home today. Contacted patient's girlfriend, Benita Gutter, she reports she has spoken with the patient and she states that she still thinks that he does sound much better today.  She reports that she would like him to continue his medications and is taking while he has been at the [facility].  She is reports that she has no safety concerns with him discharging home today and that she will pick him up from the facility.  Stay Summary: Patient 35 year old male presented to the [facility] due to paranoia and hallucinations after reportedly using PCP.  Patient is accompanied by his girlfriend and there is no reported or documented psychiatric history.  Patient's girlfriend reports that he has been using the PCP for several days and his symptoms started shortly after taking them.  Patient was admitted to the continuous observation unit for overnight assessment and started on Zyprexa 10 mg.  Patient remained overnight and today patient is denying any suicidal or homicidal ideations and denied any hallucinations.  Patient agrees to continue his current home medications and feels safer to go home.  Patient does not appear to be paranoid and has had a logical conversation.  Safety planning collateral information was made with patient's girlfriend and she  agrees to take the patient home.  Patient was provided with outpatient resources and encouraged to have his Medicaid switched to [facility] since he is moved here from [place].  Patient continued to deny any suicidal or homicidal ideations and denied any hallucinations.  Patient was provided with a 30-day prescription of his medications.  Total Time spent with patient: 30 minutes  Past Psychiatric History: None reported Past Medical History: No past medical history on file. No past surgical history on file. Family History: No family history on file. Family Psychiatric History: None reported Social History:  Social History   Substance and Sexual Activity  Alcohol Use No     Social History   Substance and Sexual Activity  Drug Use No    Social History   Socioeconomic History  . Marital status: Single    Spouse name: Not on file  . Number of children: Not on file  . Years of education: Not on file  . Highest education level: Not on file  Occupational History  . Not on file  Tobacco Use  . Smoking status: Light Tobacco Smoker    Types: Cigarettes  . Smokeless tobacco: Never Used  Substance and Sexual Activity  . Alcohol use: No  . Drug use: No  . Sexual activity: Not on file  Other Topics Concern  . Not on file  Social History Narrative  . Not on file   Social Determinants of Health   Financial Resource Strain: Not on file  Food Insecurity: Not on file  Transportation  Needs: Not on file  Physical Activity: Not on file  Stress: Not on file  Social Connections: Not on file   SDOH:  SDOH Screenings   Alcohol Screen: Not on file  Depression (LZJ6-7): Not on file  Financial Resource Strain: Not on file  Food Insecurity: Not on file  Housing: Not on file  Physical Activity: Not on file  Social Connections: Not on file  Stress: Not on file  Tobacco Use: High Risk  . Smoking Tobacco Use: Light Tobacco Smoker  . Smokeless Tobacco Use: Never Used   Transportation Needs: Not on file    Has this patient used any form of tobacco in the last 30 days? (Cigarettes, Smokeless Tobacco, Cigars, and/or Pipes) A prescription for an FDA-approved tobacco cessation medication was offered at discharge and the patient refused  Current Medications:  Current Facility-Administered Medications  Medication Dose Route Frequency Provider Last Rate Last Admin  . acetaminophen (TYLENOL) tablet 650 mg  650 mg Oral Q6H PRN Estella Husk, MD      . alum & mag hydroxide-simeth (MAALOX/MYLANTA) 200-200-20 MG/5ML suspension 30 mL  30 mL Oral Q4H PRN Estella Husk, MD      . hydrOXYzine (ATARAX/VISTARIL) tablet 25 mg  25 mg Oral TID PRN Estella Husk, MD   25 mg at 10/10/20 2105  . magnesium hydroxide (MILK OF MAGNESIA) suspension 30 mL  30 mL Oral Daily PRN Estella Husk, MD      . OLANZapine zydis (ZYPREXA) disintegrating tablet 10 mg  10 mg Oral Daily Estella Husk, MD   10 mg at 10/11/20 0917  . traZODone (DESYREL) tablet 50 mg  50 mg Oral QHS PRN Estella Husk, MD   50 mg at 10/10/20 2105   Current Outpatient Medications  Medication Sig Dispense Refill  . OLANZapine zydis (ZYPREXA) 10 MG disintegrating tablet Take 1 tablet (10 mg total) by mouth at bedtime. 30 tablet 0    PTA Medications: (Not in a hospital admission)   Musculoskeletal  Strength & Muscle Tone: within normal limits Gait & Station: normal Patient leans: N/A  Psychiatric Specialty Exam  Presentation  General Appearance: Appropriate for Environment; Casual  Eye Contact:Good  Speech:Clear and Coherent; Normal Rate  Speech Volume:Normal  Handedness:Right   Mood and Affect  Mood:Euthymic  Affect:Congruent; Appropriate   Thought Process  Thought Processes:Coherent  Descriptions of Associations:Intact  Orientation:Full (Time, Place and Person)  Thought Content:WDL  Hallucinations:Hallucinations: None Description of Auditory  Hallucinations: last heard today, voices saying "I'm gonan be alright" Description of Visual Hallucinations: reports VH but unable to describe  Ideas of Reference:None  Suicidal Thoughts:Suicidal Thoughts: No  Homicidal Thoughts:Homicidal Thoughts: No   Sensorium  Memory:Immediate Good; Recent Good; Remote Good  Judgment:Fair  Insight:Fair   Executive Functions  Concentration:Good  Attention Span:Good  Recall:Good  Fund of Knowledge:Good  Language:Good   Psychomotor Activity  Psychomotor Activity:Psychomotor Activity: Normal   Assets  Assets:Communication Skills; Desire for Improvement; Financial Resources/Insurance; Housing; Physical Health; Social Support; Transportation   Sleep  Sleep:Sleep: Good   Nutritional Assessment (For OBS and FBC admissions only) Has the patient had a weight loss or gain of 10 pounds or more in the last 3 months?: -- (UTA. Patient paranoa interfering with answering questions) Has the patient had a decrease in food intake/or appetite?: -- (UTA. Patient paranoa interfering with answering questions) Does the patient have dental problems?: -- (UTA. Patient paranoa interfering with answering questions) Does the patient have eating habits or behaviors that  may be indicators of an eating disorder including binging or inducing vomiting?: -- (UTA. Patient paranoa interfering with answering questions) Has the patient recently lost weight without trying?: -- (UTA. Patient paranoa interfering with answering questions) Has the patient been eating poorly because of a decreased appetite?: -- (UTA. Patient paranoa interfering with answering questions)    Physical Exam  Physical Exam Vitals and nursing note reviewed.  Constitutional:      Appearance: He is well-developed.  HENT:     Head: Normocephalic.  Eyes:     Pupils: Pupils are equal, round, and reactive to light.  Cardiovascular:     Rate and Rhythm: Normal rate.  Pulmonary:     Effort:  Pulmonary effort is normal.  Musculoskeletal:        General: Normal range of motion.  Neurological:     Mental Status: He is alert and oriented to person, place, and time.    Review of Systems  Constitutional: Negative.   HENT: Negative.   Eyes: Negative.   Respiratory: Negative.   Cardiovascular: Negative.   Gastrointestinal: Negative.   Genitourinary: Negative.   Musculoskeletal: Negative.   Skin: Negative.   Neurological: Negative.   Endo/Heme/Allergies: Negative.   Psychiatric/Behavioral: Negative.    Blood pressure (!) 154/113, pulse 88, temperature 97.9 F (36.6 C), temperature source Temporal, resp. rate 18, height 5\' 10"  (1.778 m), weight 246 lb (111.6 kg), SpO2 100 %. Body mass index is 35.3 kg/m.  Demographic Factors:  Male  Loss Factors: NA  Historical Factors: NA  Risk Reduction Factors:   Sense of responsibility to family, Employed, Living with another person, especially a relative and Positive social support  Continued Clinical Symptoms:  Alcohol/Substance Abuse/Dependencies  Cognitive Features That Contribute To Risk:  None    Suicide Risk:  Minimal: No identifiable suicidal ideation.  Patients presenting with no risk factors but with morbid ruminations; may be classified as minimal risk based on the severity of the depressive symptoms  Plan Of Care/Follow-up recommendations:  Continue activity as tolerated. Continue diet as recommended by your PCP. Ensure to keep all appointments with outpatient providers.  Disposition: Discharge home to Girlfriend  , FNP 10/11/2020, 1:56 PM

## 2020-10-11 NOTE — ED Notes (Signed)
GIVEN SANDWICH 

## 2020-10-11 NOTE — ED Notes (Signed)
Discharge instructions provided and Pt stated understanding. Personal belongings returned from locker. Pt alert, orient and ambulatory. Safety maintained and Pt escorted to the front lobby.

## 2020-12-17 ENCOUNTER — Emergency Department (HOSPITAL_COMMUNITY)

## 2020-12-17 ENCOUNTER — Inpatient Hospital Stay (HOSPITAL_COMMUNITY)
Admission: EM | Admit: 2020-12-17 | Discharge: 2020-12-19 | DRG: 557 | Disposition: A | Discharge status: Still In Hospital | Payer: Medicaid - Out of State | Attending: Family Medicine | Admitting: Family Medicine

## 2020-12-17 DIAGNOSIS — T50901A Poisoning by unspecified drugs, medicaments and biological substances, accidental (unintentional), initial encounter: Secondary | ICD-10-CM | POA: Diagnosis present

## 2020-12-17 DIAGNOSIS — F19959 Other psychoactive substance use, unspecified with psychoactive substance-induced psychotic disorder, unspecified: Secondary | ICD-10-CM

## 2020-12-17 DIAGNOSIS — Z20822 Contact with and (suspected) exposure to covid-19: Secondary | ICD-10-CM | POA: Diagnosis present

## 2020-12-17 DIAGNOSIS — M6282 Rhabdomyolysis: Secondary | ICD-10-CM | POA: Diagnosis not present

## 2020-12-17 DIAGNOSIS — D72829 Elevated white blood cell count, unspecified: Secondary | ICD-10-CM | POA: Diagnosis present

## 2020-12-17 DIAGNOSIS — I248 Other forms of acute ischemic heart disease: Secondary | ICD-10-CM | POA: Diagnosis present

## 2020-12-17 DIAGNOSIS — F1690 Hallucinogen use, unspecified, uncomplicated: Secondary | ICD-10-CM | POA: Diagnosis present

## 2020-12-17 DIAGNOSIS — F19129 Other psychoactive substance abuse with intoxication, unspecified: Secondary | ICD-10-CM | POA: Diagnosis present

## 2020-12-17 DIAGNOSIS — F17210 Nicotine dependence, cigarettes, uncomplicated: Secondary | ICD-10-CM | POA: Diagnosis present

## 2020-12-17 DIAGNOSIS — E669 Obesity, unspecified: Secondary | ICD-10-CM | POA: Diagnosis present

## 2020-12-17 DIAGNOSIS — E860 Dehydration: Secondary | ICD-10-CM | POA: Diagnosis present

## 2020-12-17 DIAGNOSIS — R000 Tachycardia, unspecified: Secondary | ICD-10-CM | POA: Diagnosis present

## 2020-12-17 DIAGNOSIS — F19159 Other psychoactive substance abuse with psychoactive substance-induced psychotic disorder, unspecified: Secondary | ICD-10-CM | POA: Diagnosis present

## 2020-12-17 DIAGNOSIS — N179 Acute kidney failure, unspecified: Secondary | ICD-10-CM

## 2020-12-17 DIAGNOSIS — E876 Hypokalemia: Secondary | ICD-10-CM

## 2020-12-17 DIAGNOSIS — G929 Unspecified toxic encephalopathy: Secondary | ICD-10-CM | POA: Diagnosis present

## 2020-12-17 DIAGNOSIS — S0181XA Laceration without foreign body of other part of head, initial encounter: Secondary | ICD-10-CM | POA: Diagnosis present

## 2020-12-17 DIAGNOSIS — R451 Restlessness and agitation: Secondary | ICD-10-CM | POA: Diagnosis present

## 2020-12-17 DIAGNOSIS — I10 Essential (primary) hypertension: Secondary | ICD-10-CM | POA: Diagnosis present

## 2020-12-17 LAB — CBC WITH DIFFERENTIAL/PLATELET
Abs Immature Granulocytes: 0.1 10*3/uL — ABNORMAL HIGH (ref 0.00–0.07)
Basophils Absolute: 0.1 10*3/uL (ref 0.0–0.1)
Basophils Relative: 0 %
Eosinophils Absolute: 0 10*3/uL (ref 0.0–0.5)
Eosinophils Relative: 0 %
HCT: 49.1 % (ref 39.0–52.0)
Hemoglobin: 16.4 g/dL (ref 13.0–17.0)
Immature Granulocytes: 1 %
Lymphocytes Relative: 9 %
Lymphs Abs: 1.8 10*3/uL (ref 0.7–4.0)
MCH: 32.2 pg (ref 26.0–34.0)
MCHC: 33.4 g/dL (ref 30.0–36.0)
MCV: 96.3 fL (ref 80.0–100.0)
Monocytes Absolute: 1.4 10*3/uL — ABNORMAL HIGH (ref 0.1–1.0)
Monocytes Relative: 7 %
Neutro Abs: 16.2 10*3/uL — ABNORMAL HIGH (ref 1.7–7.7)
Neutrophils Relative %: 83 %
Platelets: 323 10*3/uL (ref 150–400)
RBC: 5.1 MIL/uL (ref 4.22–5.81)
RDW: 12.3 % (ref 11.5–15.5)
WBC: 19.6 10*3/uL — ABNORMAL HIGH (ref 4.0–10.5)
nRBC: 0 % (ref 0.0–0.2)

## 2020-12-17 LAB — COMPREHENSIVE METABOLIC PANEL
ALT: 57 U/L — ABNORMAL HIGH (ref 0–44)
AST: 92 U/L — ABNORMAL HIGH (ref 15–41)
Albumin: 4.6 g/dL (ref 3.5–5.0)
Alkaline Phosphatase: 59 U/L (ref 38–126)
Anion gap: 19 — ABNORMAL HIGH (ref 5–15)
BUN: 16 mg/dL (ref 6–20)
CO2: 16 mmol/L — ABNORMAL LOW (ref 22–32)
Calcium: 9.6 mg/dL (ref 8.9–10.3)
Chloride: 101 mmol/L (ref 98–111)
Creatinine, Ser: 2.28 mg/dL — ABNORMAL HIGH (ref 0.61–1.24)
GFR, Estimated: 37 mL/min — ABNORMAL LOW (ref >60)
Glucose, Bld: 178 mg/dL — ABNORMAL HIGH (ref 70–99)
Potassium: 3.4 mmol/L — ABNORMAL LOW (ref 3.5–5.1)
Sodium: 136 mmol/L (ref 135–145)
Total Bilirubin: 1.9 mg/dL — ABNORMAL HIGH (ref 0.3–1.2)
Total Protein: 7.9 g/dL (ref 6.5–8.1)

## 2020-12-17 LAB — I-STAT VENOUS BLOOD GAS, ED
Acid-base deficit: 7 mmol/L — ABNORMAL HIGH (ref 0.0–2.0)
Bicarbonate: 16.5 mmol/L — ABNORMAL LOW (ref 20.0–28.0)
Calcium, Ion: 1.1 mmol/L — ABNORMAL LOW (ref 1.15–1.40)
HCT: 50 % (ref 39.0–52.0)
Hemoglobin: 17 g/dL (ref 13.0–17.0)
O2 Saturation: 93 %
Potassium: 3.3 mmol/L — ABNORMAL LOW (ref 3.5–5.1)
Sodium: 138 mmol/L (ref 135–145)
TCO2: 17 mmol/L — ABNORMAL LOW (ref 22–32)
pCO2, Ven: 29.2 mmHg — ABNORMAL LOW (ref 44.0–60.0)
pH, Ven: 7.36 (ref 7.250–7.430)
pO2, Ven: 68 mmHg — ABNORMAL HIGH (ref 32.0–45.0)

## 2020-12-17 LAB — I-STAT ARTERIAL BLOOD GAS, ED
Acid-base deficit: 3 mmol/L — ABNORMAL HIGH (ref 0.0–2.0)
Bicarbonate: 21.3 mmol/L (ref 20.0–28.0)
Calcium, Ion: 1.24 mmol/L (ref 1.15–1.40)
HCT: 47 % (ref 39.0–52.0)
Hemoglobin: 16 g/dL (ref 13.0–17.0)
O2 Saturation: 93 %
Patient temperature: 100.5
Potassium: 2.9 mmol/L — ABNORMAL LOW (ref 3.5–5.1)
Sodium: 138 mmol/L (ref 135–145)
TCO2: 22 mmol/L (ref 22–32)
pCO2 arterial: 35.8 mmHg (ref 32.0–48.0)
pH, Arterial: 7.386 (ref 7.350–7.450)
pO2, Arterial: 71 mmHg — ABNORMAL LOW (ref 83.0–108.0)

## 2020-12-17 LAB — PROTIME-INR
INR: 1.1 (ref 0.8–1.2)
Prothrombin Time: 14.6 seconds (ref 11.4–15.2)

## 2020-12-17 LAB — RESP PANEL BY RT-PCR (FLU A&B, COVID) ARPGX2
Influenza A by PCR: NEGATIVE
Influenza B by PCR: NEGATIVE
SARS Coronavirus 2 by RT PCR: NEGATIVE

## 2020-12-17 LAB — I-STAT CHEM 8, ED
BUN: 18 mg/dL (ref 6–20)
Calcium, Ion: 1.09 mmol/L — ABNORMAL LOW (ref 1.15–1.40)
Chloride: 103 mmol/L (ref 98–111)
Creatinine, Ser: 2 mg/dL — ABNORMAL HIGH (ref 0.61–1.24)
Glucose, Bld: 171 mg/dL — ABNORMAL HIGH (ref 70–99)
HCT: 50 % (ref 39.0–52.0)
Hemoglobin: 17 g/dL (ref 13.0–17.0)
Potassium: 3.3 mmol/L — ABNORMAL LOW (ref 3.5–5.1)
Sodium: 137 mmol/L (ref 135–145)
TCO2: 17 mmol/L — ABNORMAL LOW (ref 22–32)

## 2020-12-17 LAB — TROPONIN I (HIGH SENSITIVITY)
Troponin I (High Sensitivity): 39 ng/L — ABNORMAL HIGH (ref <18)
Troponin I (High Sensitivity): 40 ng/L — ABNORMAL HIGH (ref <18)

## 2020-12-17 LAB — ACETAMINOPHEN LEVEL: Acetaminophen (Tylenol), Serum: <10 ug/mL — ABNORMAL LOW (ref 10–30)

## 2020-12-17 LAB — CK: Total CK: 4682 U/L — ABNORMAL HIGH (ref 49–397)

## 2020-12-17 LAB — ETHANOL: Alcohol, Ethyl (B): <10 mg/dL (ref <10)

## 2020-12-17 LAB — LIPASE, BLOOD: Lipase: 26 U/L (ref 11–51)

## 2020-12-17 LAB — LACTIC ACID, PLASMA
Lactic Acid, Venous: 8.5 mmol/L (ref 0.5–1.9)
Lactic Acid, Venous: 3.2 mmol/L (ref 0.5–1.9)

## 2020-12-17 MED ORDER — LACTATED RINGERS IV SOLN: INTRAVENOUS | Status: DC

## 2020-12-17 MED ORDER — LACTATED RINGERS IV BOLUS
1000.0000 mL | Freq: Once | INTRAVENOUS | Status: AC
  Administered 2020-12-17: 1000 mL via INTRAVENOUS

## 2020-12-17 MED ORDER — LIDOCAINE-EPINEPHRINE (PF) 2 %-1:200000 IJ SOLN
10.0000 mL | Freq: Once | INTRAMUSCULAR | Status: AC
  Administered 2020-12-17: 10 mL via INTRADERMAL
  Filled 2020-12-17: qty 20

## 2020-12-17 MED ORDER — ACETAMINOPHEN 325 MG PO TABS
650.0000 mg | ORAL_TABLET | Freq: Four times a day (QID) | ORAL | Status: DC | PRN
  Filled 2020-12-17: qty 2

## 2020-12-17 MED ORDER — ACETAMINOPHEN 650 MG RE SUPP: 650.0000 mg | Freq: Four times a day (QID) | RECTAL | Status: DC | PRN

## 2020-12-17 MED ORDER — HEPARIN SODIUM (PORCINE) 5000 UNIT/ML IJ SOLN
5000.0000 [IU] | Freq: Three times a day (TID) | INTRAMUSCULAR | Status: DC
  Administered 2020-12-18 – 2020-12-19 (×3): 5000 [IU] via SUBCUTANEOUS
  Filled 2020-12-17 (×4): qty 1
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
## 2020-12-17 NOTE — ED Notes (Signed)
Pt back from CT

## 2020-12-17 NOTE — ED Notes (Signed)
Patient transported to CT with RN 
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
## 2020-12-17 NOTE — ED Notes (Signed)
MD and PA notified of critical lactic  

## 2020-12-18 DIAGNOSIS — F19959 Other psychoactive substance use, unspecified with psychoactive substance-induced psychotic disorder, unspecified: Secondary | ICD-10-CM | POA: Diagnosis not present

## 2020-12-18 LAB — HEPATITIS PANEL, ACUTE
HCV Ab: NONREACTIVE
Hep A IgM: NONREACTIVE
Hep B C IgM: NONREACTIVE
Hepatitis B Surface Ag: NONREACTIVE

## 2020-12-18 LAB — RAPID URINE DRUG SCREEN, HOSP PERFORMED
Amphetamines: NOT DETECTED
Barbiturates: NOT DETECTED
Benzodiazepines: NOT DETECTED
Cocaine: NOT DETECTED
Opiates: NOT DETECTED
Tetrahydrocannabinol: POSITIVE — AB

## 2020-12-18 LAB — CBC
HCT: 42 % (ref 39.0–52.0)
HCT: 44.6 % (ref 39.0–52.0)
Hemoglobin: 14.4 g/dL (ref 13.0–17.0)
Hemoglobin: 14.7 g/dL (ref 13.0–17.0)
MCH: 32.8 pg (ref 26.0–34.0)
MCH: 31.8 pg (ref 26.0–34.0)
MCHC: 34.3 g/dL (ref 30.0–36.0)
MCHC: 33 g/dL (ref 30.0–36.0)
MCV: 95.7 fL (ref 80.0–100.0)
MCV: 96.5 fL (ref 80.0–100.0)
Platelets: 252 10*3/uL (ref 150–400)
Platelets: 240 10*3/uL (ref 150–400)
RBC: 4.39 MIL/uL (ref 4.22–5.81)
RBC: 4.62 MIL/uL (ref 4.22–5.81)
RDW: 12.2 % (ref 11.5–15.5)
RDW: 12.3 % (ref 11.5–15.5)
WBC: 18.3 10*3/uL — ABNORMAL HIGH (ref 4.0–10.5)
WBC: 13.4 10*3/uL — ABNORMAL HIGH (ref 4.0–10.5)
nRBC: 0 % (ref 0.0–0.2)
nRBC: 0 % (ref 0.0–0.2)

## 2020-12-18 LAB — CK
Total CK: 4213 U/L — ABNORMAL HIGH (ref 49–397)
Total CK: 4320 U/L — ABNORMAL HIGH (ref 49–397)
Total CK: 3475 U/L — ABNORMAL HIGH (ref 49–397)

## 2020-12-18 LAB — COMPREHENSIVE METABOLIC PANEL
ALT: 50 U/L — ABNORMAL HIGH (ref 0–44)
AST: 84 U/L — ABNORMAL HIGH (ref 15–41)
Albumin: 3.7 g/dL (ref 3.5–5.0)
Alkaline Phosphatase: 50 U/L (ref 38–126)
Anion gap: 8 (ref 5–15)
BUN: 10 mg/dL (ref 6–20)
CO2: 24 mmol/L (ref 22–32)
Calcium: 9.2 mg/dL (ref 8.9–10.3)
Chloride: 105 mmol/L (ref 98–111)
Creatinine, Ser: 1.32 mg/dL — ABNORMAL HIGH (ref 0.61–1.24)
GFR, Estimated: >60 mL/min (ref >60)
Glucose, Bld: 118 mg/dL — ABNORMAL HIGH (ref 70–99)
Potassium: 3.6 mmol/L (ref 3.5–5.1)
Sodium: 137 mmol/L (ref 135–145)
Total Bilirubin: 2.6 mg/dL — ABNORMAL HIGH (ref 0.3–1.2)
Total Protein: 6.7 g/dL (ref 6.5–8.1)

## 2020-12-18 LAB — URINALYSIS, ROUTINE W REFLEX MICROSCOPIC
Bilirubin Urine: NEGATIVE
Glucose, UA: NEGATIVE mg/dL
Ketones, ur: 5 mg/dL — AB
Leukocytes,Ua: NEGATIVE
Nitrite: NEGATIVE
Protein, ur: 100 mg/dL — AB
Specific Gravity, Urine: 1.019 (ref 1.005–1.030)
pH: 6 (ref 5.0–8.0)

## 2020-12-18 LAB — MAGNESIUM: Magnesium: 2.2 mg/dL (ref 1.7–2.4)

## 2020-12-18 LAB — CBG MONITORING, ED: Glucose-Capillary: 117 mg/dL — ABNORMAL HIGH (ref 70–99)

## 2020-12-18 LAB — HIV ANTIBODY (ROUTINE TESTING W REFLEX): HIV Screen 4th Generation wRfx: NONREACTIVE

## 2020-12-18 LAB — LACTIC ACID, PLASMA
Lactic Acid, Venous: 1.4 mmol/L (ref 0.5–1.9)
Lactic Acid, Venous: 1.2 mmol/L (ref 0.5–1.9)

## 2020-12-18 LAB — CREATININE, SERUM
Creatinine, Ser: 1.34 mg/dL — ABNORMAL HIGH (ref 0.61–1.24)
GFR, Estimated: >60 mL/min (ref >60)

## 2020-12-18 LAB — PHOSPHORUS: Phosphorus: 3.7 mg/dL (ref 2.5–4.6)

## 2020-12-18 MED ORDER — ACETAMINOPHEN 10 MG/ML IV SOLN
1000.0000 mg | Freq: Once | INTRAVENOUS | Status: AC
  Administered 2020-12-18: 1000 mg via INTRAVENOUS
  Filled 2020-12-18: qty 100

## 2020-12-18 MED ORDER — BENZTROPINE MESYLATE 1 MG/ML IJ SOLN
1.0000 mg | Freq: Two times a day (BID) | INTRAMUSCULAR | Status: DC | PRN
Start: 2020-12-18 — End: 2020-12-19
  Filled 2020-12-18: qty 1

## 2020-12-18 MED ORDER — DIPHENHYDRAMINE HCL 25 MG PO CAPS
50.0000 mg | ORAL_CAPSULE | Freq: Four times a day (QID) | ORAL | Status: DC | PRN
Start: 2020-12-18 — End: 2020-12-19
  Administered 2020-12-18: 50 mg via ORAL
  Filled 2020-12-18: qty 2

## 2020-12-18 MED ORDER — BENZTROPINE MESYLATE 1 MG PO TABS
1.0000 mg | ORAL_TABLET | Freq: Two times a day (BID) | ORAL | Status: DC | PRN
Start: 2020-12-18 — End: 2020-12-19
  Filled 2020-12-18: qty 1

## 2020-12-18 MED ORDER — DIPHENHYDRAMINE HCL 25 MG PO CAPS: 50.0000 mg | ORAL_CAPSULE | Freq: Four times a day (QID) | ORAL | Status: DC | PRN

## 2020-12-18 MED ORDER — HALOPERIDOL 5 MG PO TABS: 5.0000 mg | ORAL_TABLET | Freq: Four times a day (QID) | ORAL | Status: DC | PRN

## 2020-12-18 MED ORDER — HALOPERIDOL LACTATE 5 MG/ML IJ SOLN
10.0000 mg | Freq: Four times a day (QID) | INTRAMUSCULAR | Status: DC | PRN
  Administered 2020-12-18: 10 mg via INTRAMUSCULAR
  Filled 2020-12-18: qty 2

## 2020-12-18 MED ORDER — HALOPERIDOL 1 MG PO TABS: 10.0000 mg | ORAL_TABLET | Freq: Four times a day (QID) | ORAL | Status: DC | PRN

## 2020-12-18 MED ORDER — POTASSIUM CHLORIDE 10 MEQ/100ML IV SOLN
10.0000 meq | Freq: Once | INTRAVENOUS | Status: AC
  Administered 2020-12-18: 10 meq via INTRAVENOUS
  Filled 2020-12-18: qty 100

## 2020-12-18 MED ORDER — HALOPERIDOL LACTATE 5 MG/ML IJ SOLN: 5.0000 mg | Freq: Four times a day (QID) | INTRAMUSCULAR | Status: DC | PRN

## 2020-12-18 MED ORDER — BENZTROPINE MESYLATE 1 MG PO TABS
1.0000 mg | ORAL_TABLET | Freq: Two times a day (BID) | ORAL | Status: DC | PRN
  Filled 2020-12-18: qty 1

## 2020-12-18 MED ORDER — POTASSIUM CHLORIDE 10 MEQ/100ML IV SOLN
10.0000 meq | INTRAVENOUS | Status: AC
  Administered 2020-12-18 (×6): 10 meq via INTRAVENOUS
  Filled 2020-12-18 (×5): qty 100

## 2020-12-18 MED ORDER — DIPHENHYDRAMINE HCL 50 MG/ML IJ SOLN
50.0000 mg | Freq: Four times a day (QID) | INTRAMUSCULAR | Status: DC | PRN
Start: 2020-12-18 — End: 2020-12-19

## 2020-12-18 MED ORDER — DIPHENHYDRAMINE HCL 50 MG/ML IJ SOLN: 50.0000 mg | Freq: Four times a day (QID) | INTRAMUSCULAR | Status: DC | PRN

## 2020-12-18 MED ORDER — LACTATED RINGERS IV SOLN: INTRAVENOUS | Status: DC

## 2020-12-18 NOTE — ED Notes (Signed)
Attempted to give reportx1 

## 2020-12-18 NOTE — ED Notes (Signed)
This RN went into pt's room to check on pt and obtain blood work. Pt is resting, A/O x4. Pt states no pain at this time. Fresh set of vital signs including Temp has been obtained. 2 [employer] officers at doorway.

## 2020-12-18 NOTE — ED Notes (Addendum)
Pt refusing to let RN hook him back up to monitoring equipment or start new IV. Pt screaming "you're not going to touch me." RN will try again later.

## 2020-12-18 NOTE — ED Notes (Signed)
This RN gave report to Florentina Addison, RN on [facility]. This RN also made Florentina Addison, RN aware of paperwork that needs to be handed to her regarding patient.
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
## 2020-12-18 NOTE — ED Notes (Signed)
Patient transported to [facility] by this RN accompanied by 2 [employer] officers. Once on unit patient ambulated from stretcher to bed with a steady gait. [employer] at doorway and purple folder with paperwork was personally handed over to [place], [place].
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
## 2020-12-18 NOTE — Progress Notes (Signed)
Was paged by RN because patient was agitated, becoming aggressive, and removing his IV. He threatened to leave AMA. Myself and Dr. Dareen Piano went to check on patient and he was very confused as to where he is and why he is here and unable to understand the consequence of leaving the hospital to the custody of [employer].   Placed order for Haldol 10mg  and will consult psych. Patient did not threaten to leave the hospital when we assessed him, but if so we will complete IVC paperwork if needed as patient lacks capacity.

## 2020-12-19 ENCOUNTER — Other Ambulatory Visit (HOSPITAL_COMMUNITY): Payer: Self-pay

## 2020-12-19 DIAGNOSIS — E876 Hypokalemia: Secondary | ICD-10-CM

## 2020-12-19 DIAGNOSIS — F19959 Other psychoactive substance use, unspecified with psychoactive substance-induced psychotic disorder, unspecified: Secondary | ICD-10-CM | POA: Diagnosis not present

## 2020-12-19 DIAGNOSIS — N179 Acute kidney failure, unspecified: Secondary | ICD-10-CM

## 2020-12-19 LAB — CBC
HCT: 43.3 % (ref 39.0–52.0)
Hemoglobin: 14.5 g/dL (ref 13.0–17.0)
MCH: 32 pg (ref 26.0–34.0)
MCHC: 33.5 g/dL (ref 30.0–36.0)
MCV: 95.6 fL (ref 80.0–100.0)
Platelets: 221 10*3/uL (ref 150–400)
RBC: 4.53 MIL/uL (ref 4.22–5.81)
RDW: 12.1 % (ref 11.5–15.5)
WBC: 8.6 10*3/uL (ref 4.0–10.5)
nRBC: 0 % (ref 0.0–0.2)

## 2020-12-19 LAB — BASIC METABOLIC PANEL
Anion gap: 9 (ref 5–15)
BUN: 5 mg/dL — ABNORMAL LOW (ref 6–20)
CO2: 25 mmol/L (ref 22–32)
Calcium: 8.8 mg/dL — ABNORMAL LOW (ref 8.9–10.3)
Chloride: 102 mmol/L (ref 98–111)
Creatinine, Ser: 1.07 mg/dL (ref 0.61–1.24)
GFR, Estimated: >60 mL/min (ref >60)
Glucose, Bld: 117 mg/dL — ABNORMAL HIGH (ref 70–99)
Potassium: 3.6 mmol/L (ref 3.5–5.1)
Sodium: 136 mmol/L (ref 135–145)

## 2020-12-19 LAB — CK: Total CK: 3363 U/L — ABNORMAL HIGH (ref 49–397)

## 2020-12-19 MED ORDER — AMLODIPINE BESYLATE 5 MG PO TABS
5.0000 mg | ORAL_TABLET | Freq: Every day | ORAL | 0 refills | Status: AC
  Filled 2020-12-19 (×2): qty 30, 30d supply, fill #0

## 2020-12-19 MED ORDER — HYDROXYZINE HCL 10 MG PO TABS
10.0000 mg | ORAL_TABLET | Freq: Three times a day (TID) | ORAL | 0 refills | Status: AC | PRN
  Filled 2020-12-19 (×2): qty 90, 30d supply, fill #0
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
## 2020-12-19 NOTE — Plan of Care (Signed)
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
## 2020-12-19 NOTE — Consult Note (Signed)
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  Recall:Fair; Poor  Fund of Knowledge:Fair  Language:Fair   Psychomotor Activity  Psychomotor Activity:Psychomotor Activity: Normal   Assets  Assets:Communication Skills; Leisure Time; Physical Health; Resilience; Social Support; Financial  Resources/Insurance   Sleep  Sleep:Sleep: Fair   Physical Exam: Physical Exam Vitals and nursing note reviewed.  Constitutional:      Appearance: Normal appearance. He is normal weight.  Neurological:     Mental Status: He is alert.  Psychiatric:        Mood and Affect: Mood normal.        Behavior: Behavior normal.        Thought Content: Thought content normal.        Judgment: Judgment normal.    Review of Systems  Psychiatric/Behavioral: Negative for depression, hallucinations, memory loss, substance abuse and suicidal ideas. The patient is not nervous/anxious and does not have insomnia.   All other systems reviewed and are negative.  Blood pressure (!) 169/119, pulse 83, temperature 98.7 F (37.1 C), temperature source Oral, resp. rate 17, SpO2 100 %. There is no height or weight on file to calculate BMI.  Treatment Plan Summary: Plan Psych cleared at this time. Conitnue current medications. Patient may benefitf from outpatient substance abuse resources to inlcude Substance ABuse Intensive outpatient program (SAIOP), or substance abuse counseling.  -Psych cleared at this time.   Disposition: No evidence of imminent risk to self or others at present.   Patient does not meet criteria for psychiatric inpatient admission. Supportive therapy provided about ongoing stressors.  Maryagnes Amos, FNP 12/19/2020 9:19 AM

## 2020-12-19 NOTE — Progress Notes (Signed)
Medication brought up by TOC, d/c education provided to [employer]. Nurse coordinator called from [facility] and she was provided with d/c instructions as well. Pt taken down in w/c  with staff member and [employer] at discharge
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
## 2020-12-19 NOTE — TOC Progression Note (Signed)
Transition of Care [facility]) - Progression Note    Patient Details  Name: Peter Holland MRN: [id number] Date of Birth: 09/13/1985  Transition of Care [facility]) CM/SW Contact  Beckie Busing, RN Phone Number: [phone]  12/19/2020, 1:07 PM  Clinical Narrative:    [facility] consulted for patient with d/c med needs and no active insurance. MATCH form has been submitted and meds have been delivered fron [facility] pharmacy. Per CM discussion with [employer] officer the officer will transport meds to [employer] to be dispensed to the patient at police department. No other needs are noted at this time. TOC will sign off.         Expected Discharge Plan and Services           Expected Discharge Date: 12/19/20                                     Social Determinants of Health (SDOH) Interventions    Readmission Risk Interventions No flowsheet data found.
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
## 2020-12-28 LAB — PHENCYCLIDINE, MS, WB/SP RFX
Phencyclidine Confirmation: POSITIVE
Phencyclidine: 54 ng/mL

## 2021-01-08 LAB — DRUG SCREEN 10 W/CONF, SERUM
Amphetamines, IA: NEGATIVE ng/mL
Barbiturates, IA: NEGATIVE ug/mL
Benzodiazepines, IA: NEGATIVE ng/mL
Cocaine & Metabolite, IA: NEGATIVE ng/mL
Methadone, IA: NEGATIVE ng/mL
Opiates, IA: NEGATIVE ng/mL
Oxycodones, IA: NEGATIVE ng/mL
Phencyclidine, IA: POSITIVE ng/mL — AB
Propoxyphene, IA: NEGATIVE ng/mL
THC(Marijuana) Metabolite, IA: POSITIVE ng/mL — AB

## 2021-01-08 LAB — THC,MS,WB/SP RFX
Cannabidiol: NEGATIVE ng/mL
Cannabinoid Confirmation: POSITIVE
Cannabinol: NEGATIVE ng/mL
Carboxy-THC: 14.3 ng/mL
Hydroxy-THC: NEGATIVE ng/mL
Tetrahydrocannabinol(THC): NEGATIVE ng/mL

## 2022-10-06 IMAGING — CT CT CERVICAL SPINE W/O CM
4 series · 16 of 33 positions shown, 19 images · non-contrast
Comparison: August 19, 2014.

CLINICAL DATA: Abnormal mental status, head trauma.

EXAM:
CT HEAD WITHOUT CONTRAST
CT CERVICAL SPINE WITHOUT CONTRAST
TECHNIQUE: Multidetector CT imaging of the head and cervical spine was
performed following the standard protocol without intravenous
contrast. Multiplanar CT image reconstructions of the cervical spine
were also generated.

[Series 5: c spine soft · axial · 0.42mm/px · z∈[-296,-220]mm · 3 of 114 slices shown]
[im 19/114  soft-tissue]
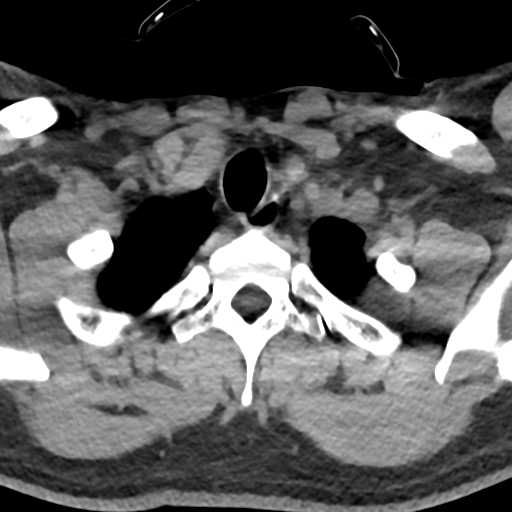
[im 38/114  soft-tissue]
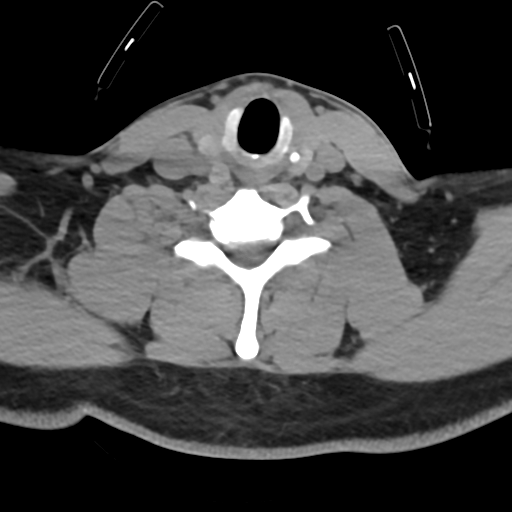
[im 57/114  soft-tissue]
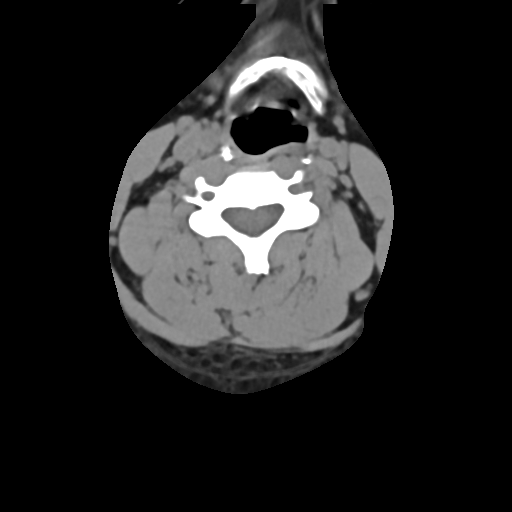

[Series 8: sag bone · sagittal · 0.34mm/px · 5 of 89 slices shown, 6 images]
[im 30/89  bone]
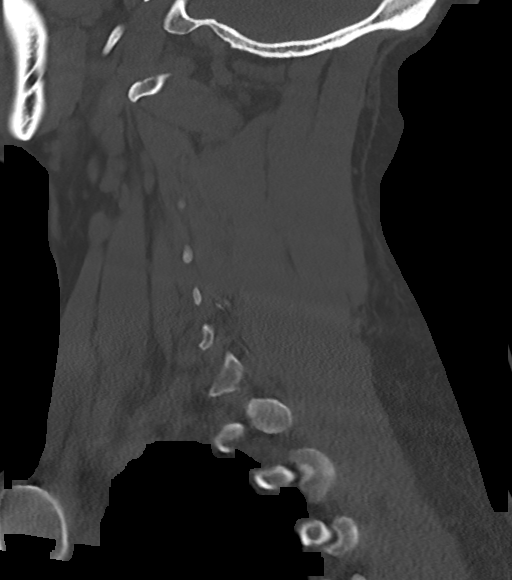
[im 37/89  bone]
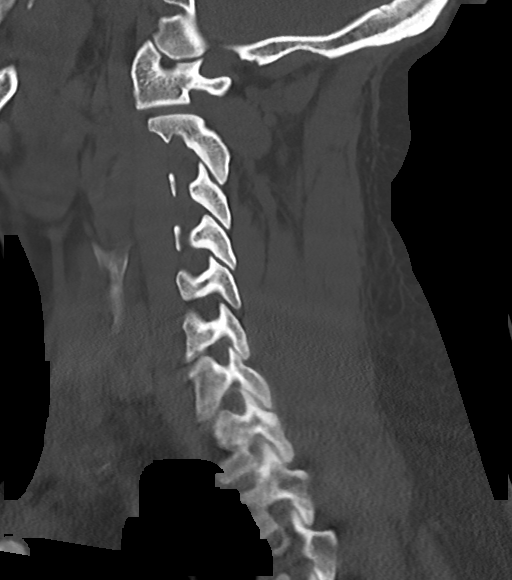
[im 45/89  soft-tissue]
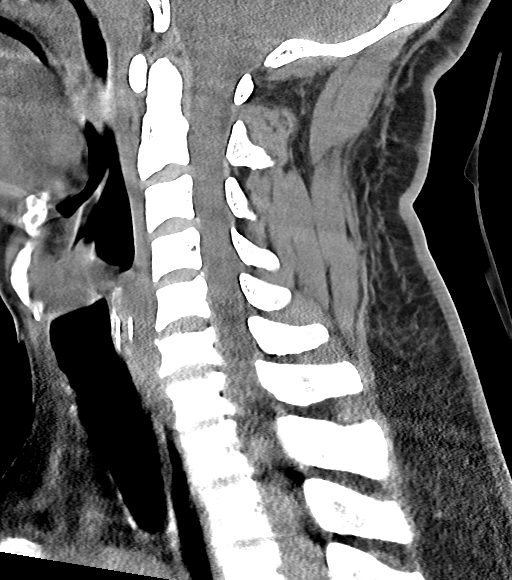
[im 45/89  bone]
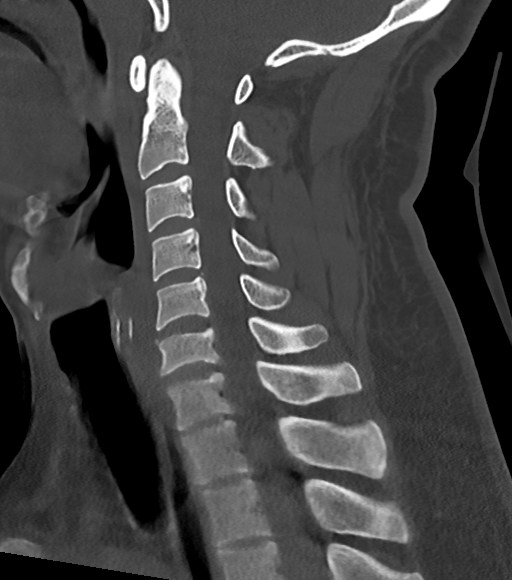
[im 52/89  bone]
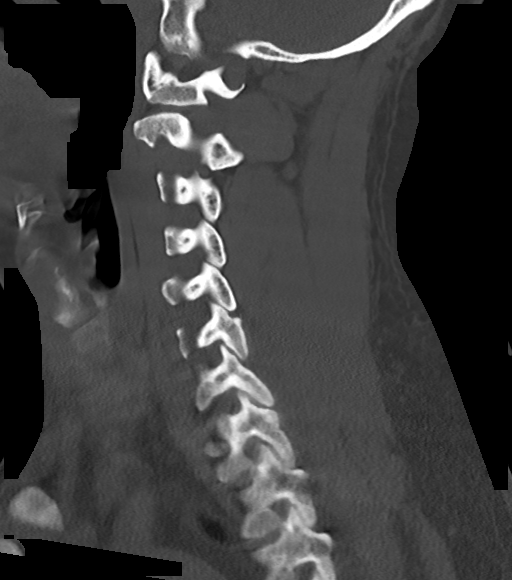
[im 59/89  bone]
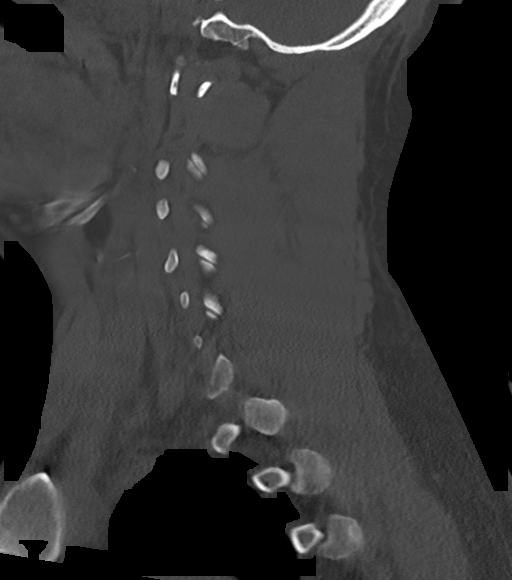

[Series 9: cor bone · coronal · 0.35mm/px · 3 of 81 slices shown]
[im 17/81  bone]
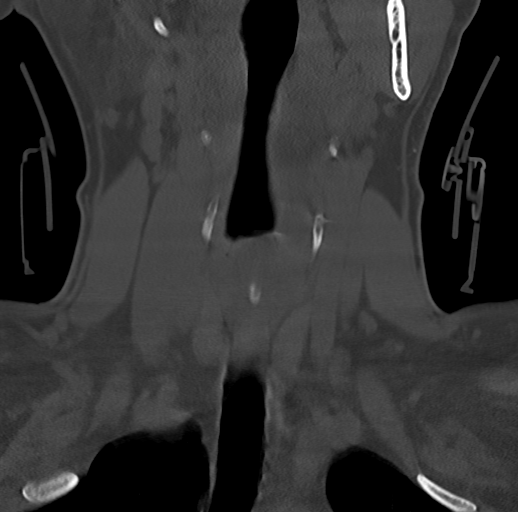
[im 33/81  bone]
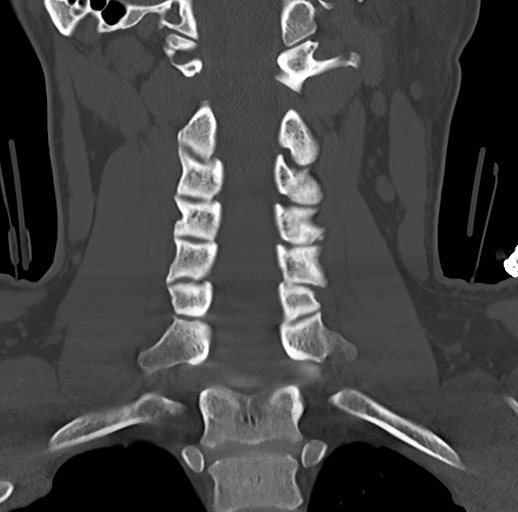
[im 49/81  bone]
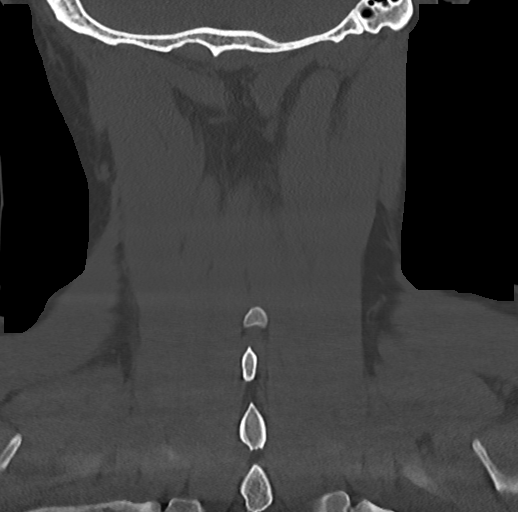

[Series 10: orthogonal axials · axial · 0.21mm/px · z∈[-316,-194]mm · 5 of 103 slices shown, 7 images]
[im 18/103  soft-tissue]
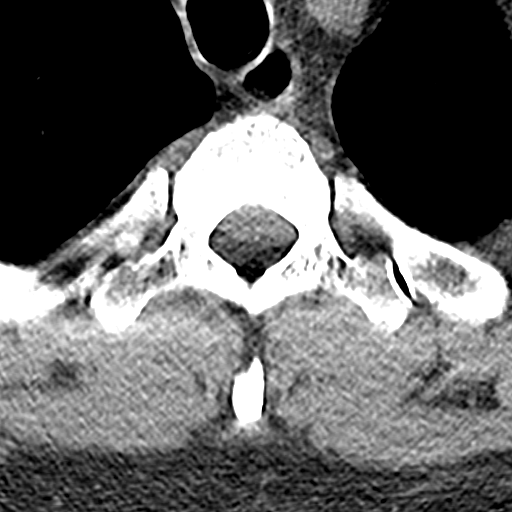
[im 18/103  bone]
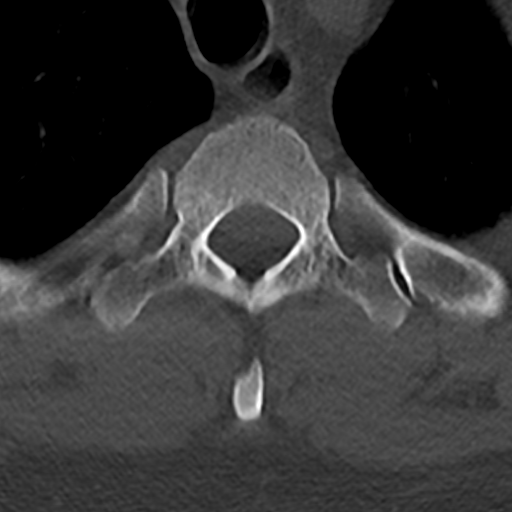
[im 35/103  bone]
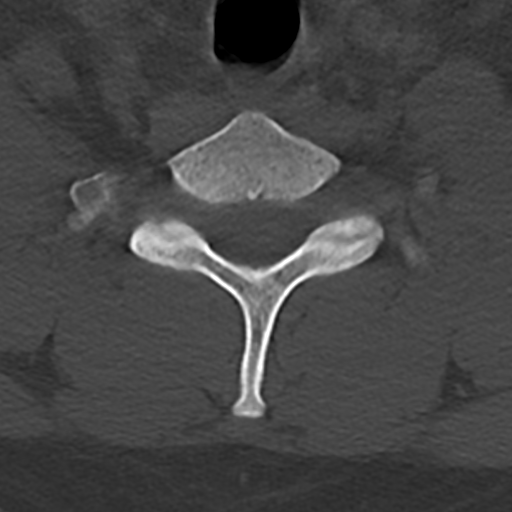
[im 52/103  bone]
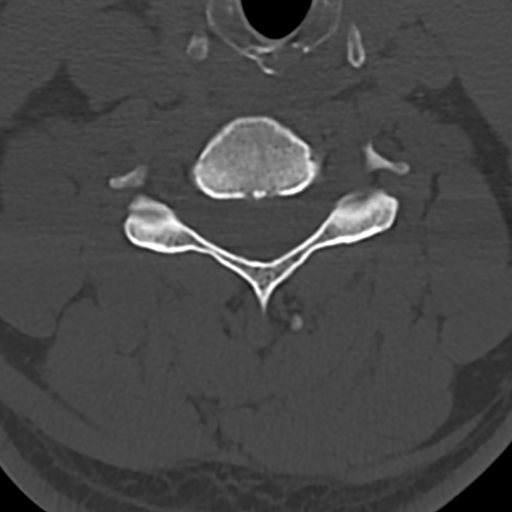
[im 69/103  bone]
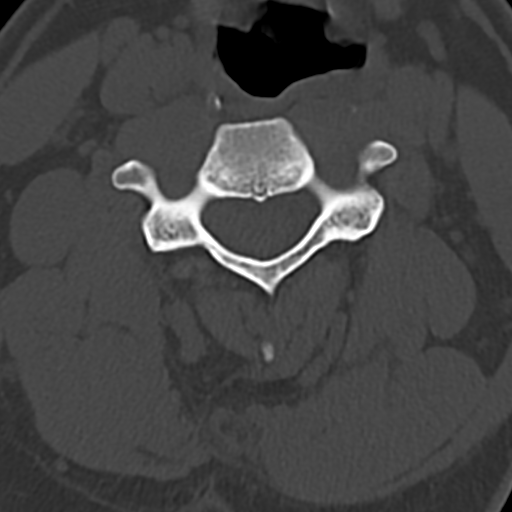
[im 86/103  soft-tissue]
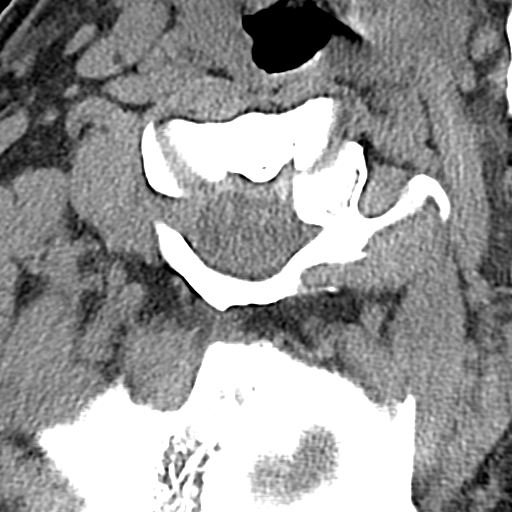
[im 86/103  bone]
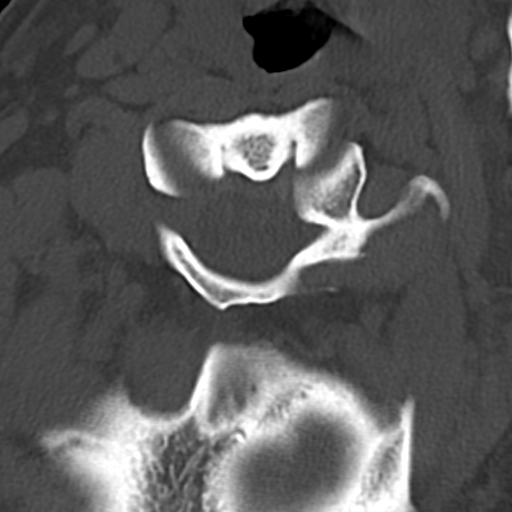

[16 of 33 positions shown; findings below may reference images not displayed]

FINDINGS: CT HEAD FINDINGS

Brain: No evidence of acute infarction, hemorrhage, hydrocephalus,
extra-axial collection or mass lesion/mass effect.

Vascular: No hyperdense vessel or unexpected calcification.

Skull: Normal. Negative for fracture or focal lesion.

Sinuses/Orbits: No acute finding.

Other: None.

CT CERVICAL SPINE FINDINGS

Alignment: Normal.

Skull base and vertebrae: No acute fracture. No primary bone lesion
or focal pathologic process.

Soft tissues and spinal canal: No prevertebral fluid or swelling. No
visible canal hematoma.

Disc levels:  Normal.

Upper chest: Negative.

Other: None.
IMPRESSION: Normal head CT.

Normal cervical spine.
